# Patient Record
Sex: Male | Born: 1975 | Race: Black or African American | Hispanic: No | Marital: Married | Smoking: Never smoker
Health system: Southern US, Community
[De-identification: ages and names within clinical notes are randomized; demographics above are authoritative.]

## PROBLEM LIST (undated history)

## (undated) DIAGNOSIS — J302 Other seasonal allergic rhinitis: Secondary | ICD-10-CM

---

## 2003-11-27 ENCOUNTER — Emergency Department (HOSPITAL_COMMUNITY): Admission: EM | Admit: 2003-11-27 | Discharge: 2003-11-28 | Payer: Self-pay | Admitting: Emergency Medicine

## 2006-06-12 HISTORY — PX: APPENDECTOMY: SHX54

## 2006-10-20 ENCOUNTER — Inpatient Hospital Stay (HOSPITAL_COMMUNITY): Admission: EM | Admit: 2006-10-20 | Discharge: 2006-10-21 | Payer: Self-pay | Admitting: Emergency Medicine

## 2006-10-20 ENCOUNTER — Encounter (INDEPENDENT_AMBULATORY_CARE_PROVIDER_SITE_OTHER): Payer: Self-pay | Admitting: Specialist

## 2008-06-05 IMAGING — CT CT PELVIS W/ CM
2 of 5 series · 17 of 46 positions shown, 19 images · IV contrast (APPLIED)
Comparison: none

HISTORY: Right lower quadrant pain, leukocytosis, question appendicitis

[Series 2: abd_pel 5.0 b40f st · axial · 0.70mm/px · z∈[-490,-55]mm · 14 of 99 slices shown, 16 images]
[im 6/99  soft-tissue]
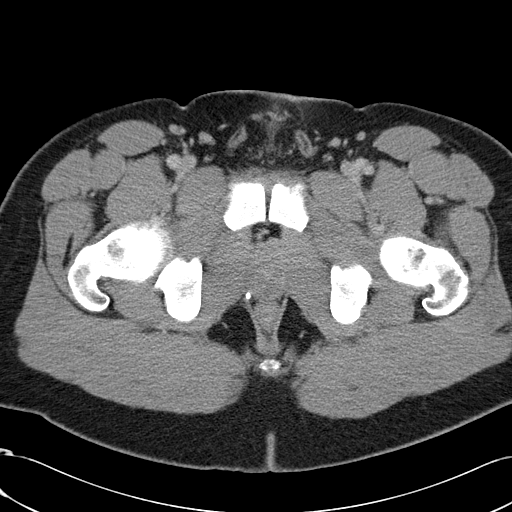
[im 6/99  bone]
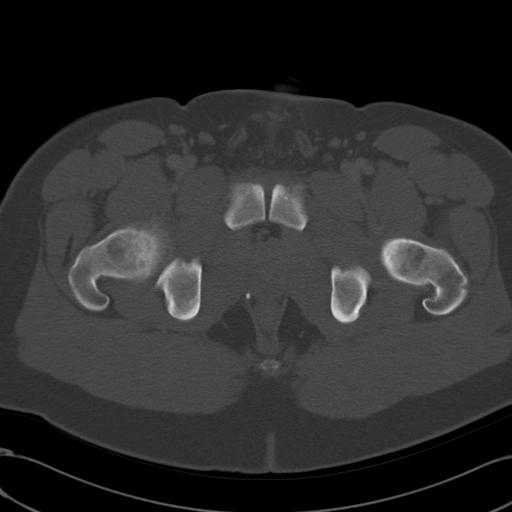
[im 11/99  soft-tissue]
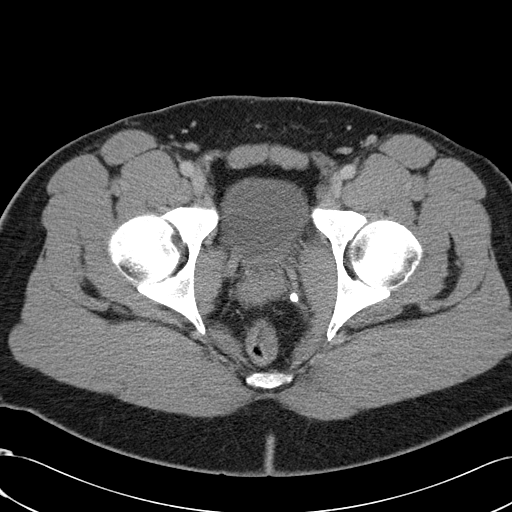
[im 21/99  soft-tissue]
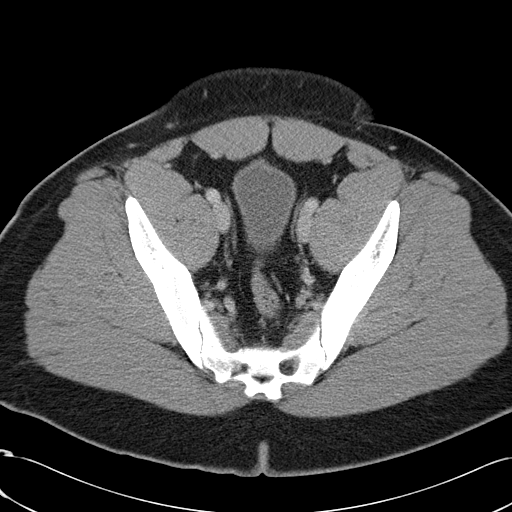
[im 26/99  soft-tissue]
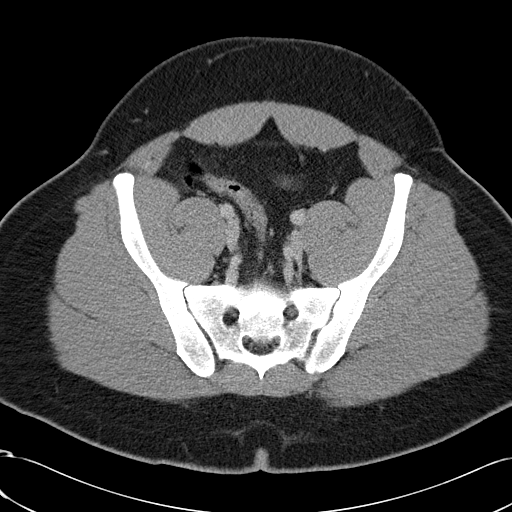
[im 31/99  soft-tissue]
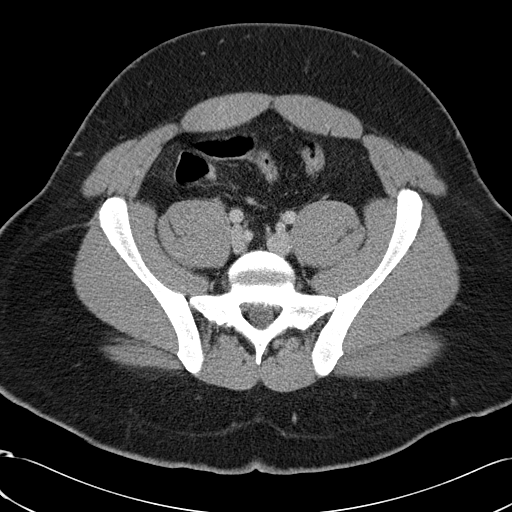
[im 42/99  soft-tissue]
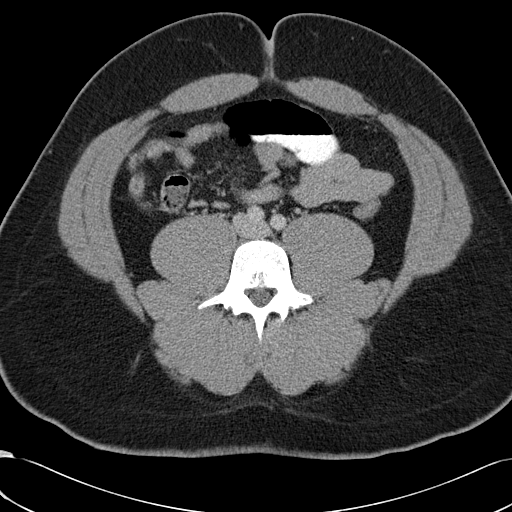
[im 47/99  soft-tissue]
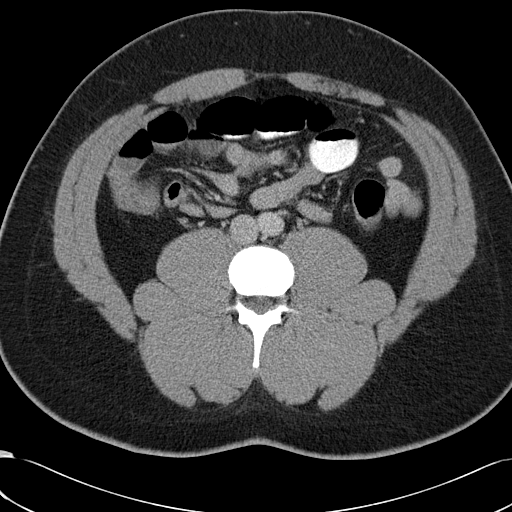
[im 52/99  soft-tissue]
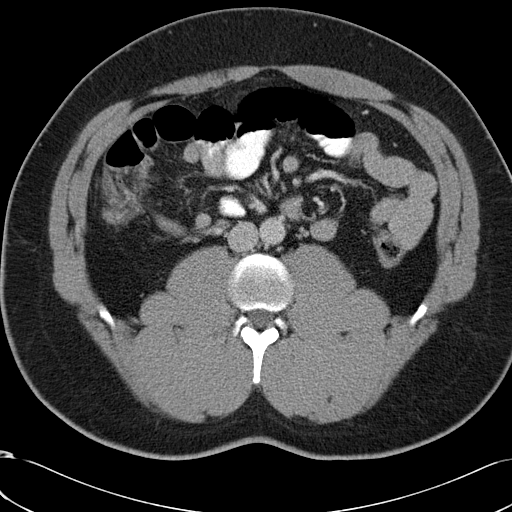
[im 57/99  soft-tissue]
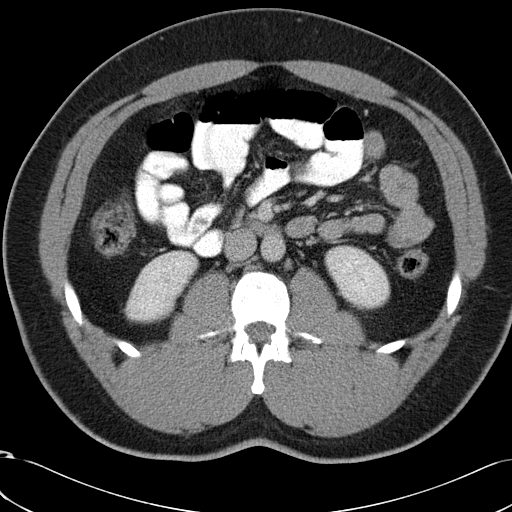
[im 57/99  bone]
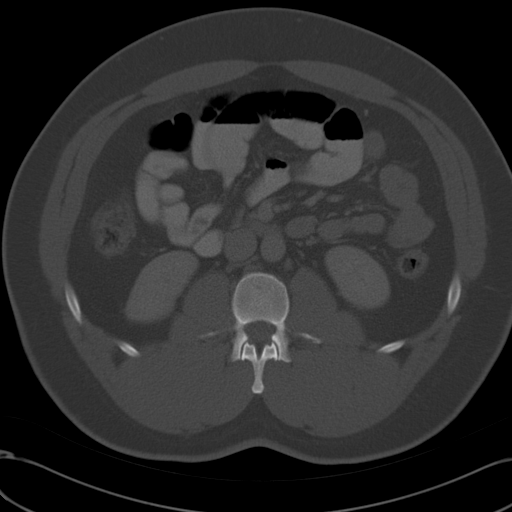
[im 68/99  soft-tissue]
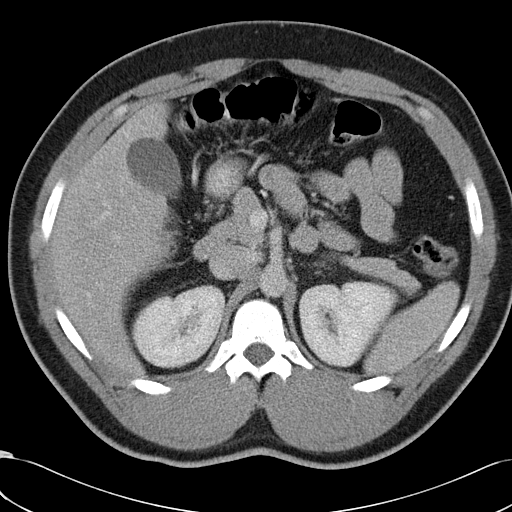
[im 73/99  soft-tissue]
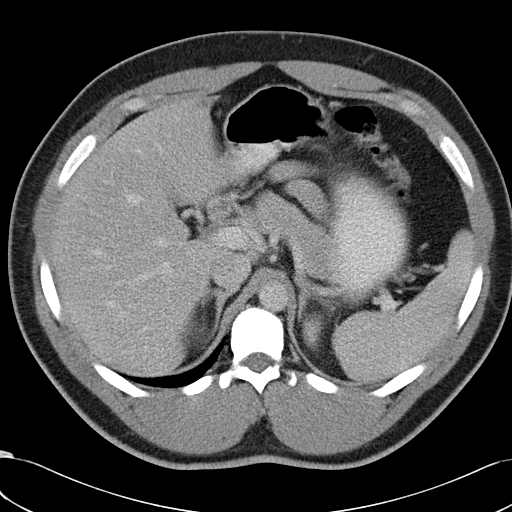
[im 78/99  soft-tissue]
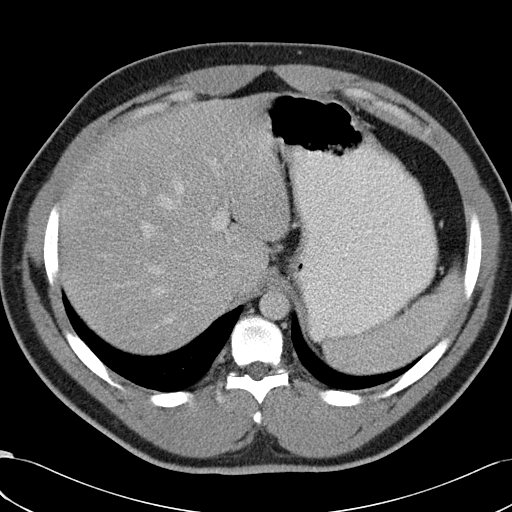
[im 88/99  soft-tissue]
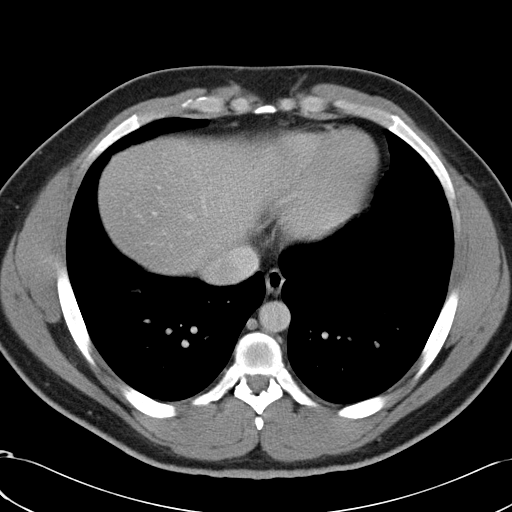
[im 93/99  soft-tissue]
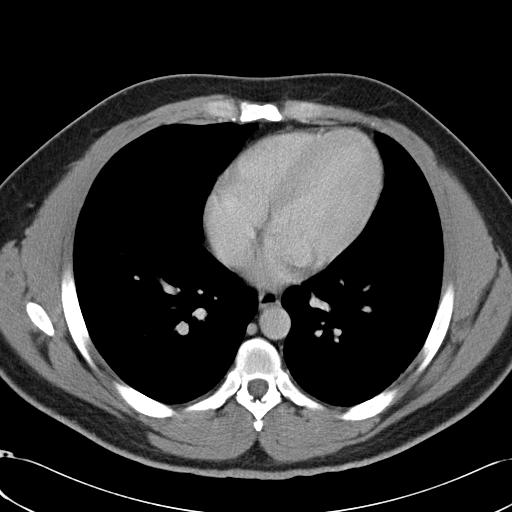

[Series 602: coronal abdomen · coronal · 1.00mm/px · 3 of 145 slices shown]
[im 49/145  soft-tissue]
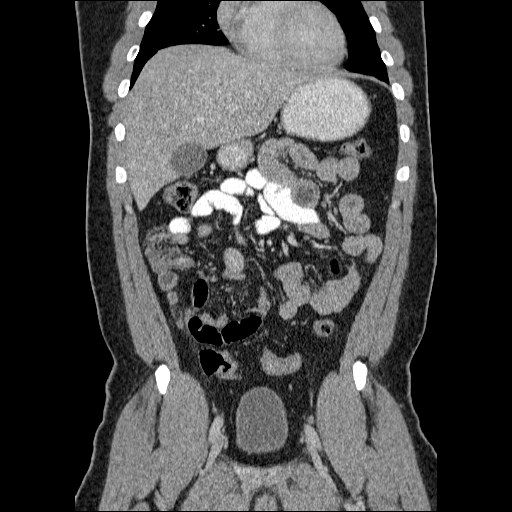
[im 65/145  soft-tissue]
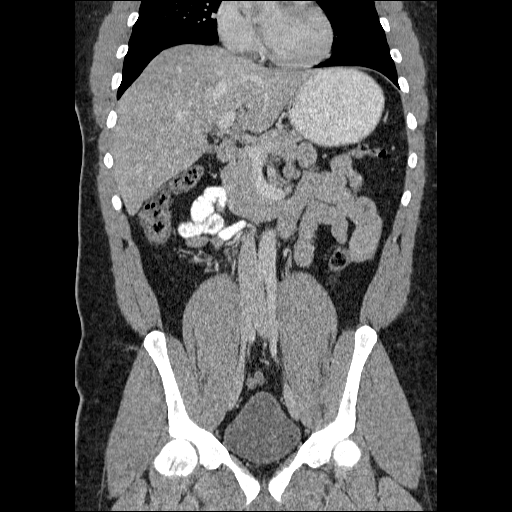
[im 81/145  soft-tissue]
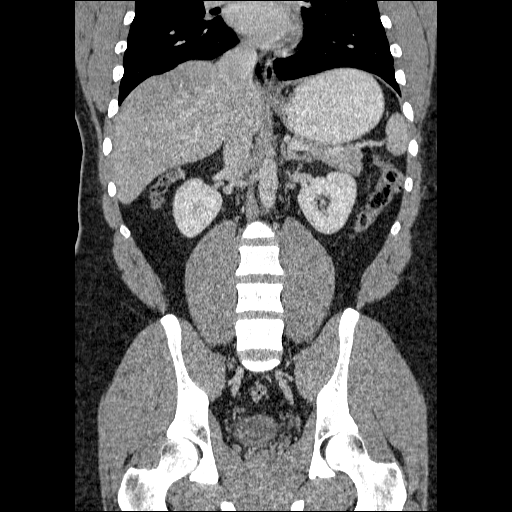

[17 of 46 positions shown; findings below may reference images not displayed]

CT ABDOMEN AND PELVIS WITH CONTRAST:

Multidetector helical CT imaging abdomen and pelvis performed.
Sagittal and coronal images are reconstructed from the axial data set.
Exam utilized dilute oral contrast and 125 cc Nmnipaque-[id number].
No prior study for comparison.

CT ABDOMEN:

Lung bases clear.
Liver, spleen, pancreas, kidneys, and adrenal gland is normal.
No upper abdominal mass, adenopathy, or free fluid.
Small appendicolith noted image 51.
Appendix thickened and enlarged with mild surrounding inflammatory changes.
Findings compatible with acute appendicitis.
No evidence of abscess or perforation.
Bones unremarkable.
IMPRESSION: Acute appendicitis.

CT PELVIS:

No pelvic mass, adenopathy, or free fluid.
No additional inflammatory processes seen.
Unremarkable bladder.
Scattered pelvic phleboliths.
No bone abnormalities.
IMPRESSION: Acute appendicitis, see above.
Findings discussed with Dr. Sen and Dr. Karbo.

## 2009-07-13 ENCOUNTER — Ambulatory Visit: Payer: Self-pay | Admitting: Diagnostic Radiology

## 2009-07-13 ENCOUNTER — Emergency Department (HOSPITAL_BASED_OUTPATIENT_CLINIC_OR_DEPARTMENT_OTHER): Admission: EM | Admit: 2009-07-13 | Discharge: 2009-07-13 | Payer: Self-pay | Admitting: Emergency Medicine

## 2009-07-14 ENCOUNTER — Ambulatory Visit: Payer: Self-pay | Admitting: Family

## 2009-07-14 DIAGNOSIS — J309 Allergic rhinitis, unspecified: Secondary | ICD-10-CM | POA: Insufficient documentation

## 2009-07-14 DIAGNOSIS — I10 Essential (primary) hypertension: Secondary | ICD-10-CM | POA: Insufficient documentation

## 2009-07-14 DIAGNOSIS — J329 Chronic sinusitis, unspecified: Secondary | ICD-10-CM | POA: Insufficient documentation

## 2009-07-23 ENCOUNTER — Ambulatory Visit: Payer: Self-pay | Admitting: Family

## 2009-07-23 LAB — CONVERTED CEMR LAB
BUN: 11 mg/dL (ref 6–23)
Basophils Absolute: 0 10*3/uL (ref 0.0–0.1)
Basophils Relative: 0 % (ref 0–1)
CO2: 25 meq/L (ref 19–32)
Creatinine, Ser: 1.18 mg/dL (ref 0.40–1.50)
Eosinophils Absolute: 0.1 10*3/uL (ref 0.0–0.7)
Eosinophils Relative: 1 % (ref 0–5)
Glucose, Bld: 150 mg/dL — ABNORMAL HIGH (ref 70–99)
HDL: 42 mg/dL (ref >39)
Hemoglobin: 15.4 g/dL (ref 13.0–17.0)
Lymphs Abs: 2.5 10*3/uL (ref 0.7–4.0)
MCV: 93.3 fL (ref 78.0–100.0)
Platelets: 304 10*3/uL (ref 150–400)
Potassium: 4.3 meq/L (ref 3.5–5.3)
RDW: 12.2 % (ref 11.5–15.5)
Sodium: 140 meq/L (ref 135–145)
Total CHOL/HDL Ratio: 3.3
VLDL: 32 mg/dL (ref 0–40)

## 2009-07-25 ENCOUNTER — Encounter: Payer: Self-pay | Admitting: Family

## 2009-08-20 ENCOUNTER — Ambulatory Visit: Payer: Self-pay | Admitting: Family

## 2009-08-20 DIAGNOSIS — K219 Gastro-esophageal reflux disease without esophagitis: Secondary | ICD-10-CM | POA: Insufficient documentation

## 2009-08-20 DIAGNOSIS — R0989 Other specified symptoms and signs involving the circulatory and respiratory systems: Secondary | ICD-10-CM

## 2009-08-20 DIAGNOSIS — R0609 Other forms of dyspnea: Secondary | ICD-10-CM

## 2009-08-22 LAB — CONVERTED CEMR LAB: Creatinine, Urine: 253.1 mg/dL

## 2009-09-03 ENCOUNTER — Ambulatory Visit: Payer: Self-pay | Admitting: Family

## 2009-09-03 DIAGNOSIS — R7309 Other abnormal glucose: Secondary | ICD-10-CM

## 2009-09-03 LAB — CONVERTED CEMR LAB
CO2: 25 meq/L (ref 19–32)
Calcium: 9.1 mg/dL (ref 8.4–10.5)
Sodium: 140 meq/L (ref 135–145)

## 2009-09-06 ENCOUNTER — Encounter: Payer: Self-pay | Admitting: Family

## 2009-09-17 ENCOUNTER — Ambulatory Visit: Payer: Self-pay | Admitting: Family

## 2009-09-17 DIAGNOSIS — D239 Other benign neoplasm of skin, unspecified: Secondary | ICD-10-CM | POA: Insufficient documentation

## 2009-10-12 ENCOUNTER — Encounter: Payer: Self-pay | Admitting: Family

## 2009-11-24 ENCOUNTER — Encounter: Payer: Self-pay | Admitting: Family

## 2010-02-11 ENCOUNTER — Ambulatory Visit: Payer: Self-pay | Admitting: Family

## 2010-02-11 DIAGNOSIS — F341 Dysthymic disorder: Secondary | ICD-10-CM

## 2010-02-17 ENCOUNTER — Ambulatory Visit: Payer: Self-pay | Admitting: Licensed Clinical Social Worker

## 2010-02-17 ENCOUNTER — Telehealth: Payer: Self-pay | Admitting: Family

## 2010-02-21 ENCOUNTER — Ambulatory Visit: Payer: Self-pay | Admitting: Family

## 2010-02-21 ENCOUNTER — Telehealth: Payer: Self-pay | Admitting: Family

## 2010-03-16 ENCOUNTER — Ambulatory Visit: Payer: Self-pay | Admitting: Family

## 2010-03-16 DIAGNOSIS — N529 Male erectile dysfunction, unspecified: Secondary | ICD-10-CM

## 2010-03-18 ENCOUNTER — Encounter: Payer: Self-pay | Admitting: Family

## 2010-03-22 ENCOUNTER — Telehealth: Payer: Self-pay | Admitting: Family

## 2010-03-22 ENCOUNTER — Encounter: Payer: Self-pay | Admitting: Family

## 2010-03-28 ENCOUNTER — Telehealth: Payer: Self-pay | Admitting: Family

## 2010-04-18 ENCOUNTER — Encounter: Payer: Self-pay | Admitting: Family

## 2010-04-18 ENCOUNTER — Telehealth: Payer: Self-pay | Admitting: Family

## 2010-07-12 NOTE — Assessment & Plan Note (Signed)
Summary: mole removal/mhf   Vital Signs:  Patient profile:   35 year old male Height:      72 inches Weight:      268.50 pounds BMI:     36.55 Temp:     98.3 degrees F oral Pulse rate:   66 / minute Pulse rhythm:   regular Resp:     16 per minute BP sitting:   118 / 84  (right arm) Cuff size:   large  Vitals Entered By: Mervin Kung CMA (September 17, 2009 9:13 AM) CC: room 3  Pt states he is here for mole removal.   CC:  room 3  Pt states he is here for mole removal..  History of Present Illness: Joel Gallagher is a 35 year old male who presents today for removal of a mole from his right lower back.  He states that this mole has been present since the age of 89- it often bothers him since it rubs against clothing.    Allergies (verified): No Known Drug Allergies  Physical Exam  General:  Well-developed,well-nourished,in no acute distress; alert,appropriate and cooperative throughout examination Head:  Normocephalic and atraumatic without obvious abnormalities. No apparent alopecia or balding. Skin:  Hyperpigmented tag like lesion on patient's right lower back.   Impression & Recommendations:  Problem # 1:  MOLE (ICD-216.9) Assessment Comment Only  Consent was signed, area was cleansed with iodine, then injection of lidocaine with epi was performed to provide local anesthesic prior to mole removal.  Mole was removed using sterile technique and placed in medium for transport to pathology.  Hemostasis was achieved with cautery.  Bandage placed.  Pt instructed to keep area clean and dry- call if redness, drainage, or swelling of the area occurs.  Pt verbalized understanding.  Orders: Shave Skin Lesion < 0.5 cm/trunk/arm/leg (11300)  Complete Medication List: 1)  Flonase 50 Mcg/act Susp (Fluticasone propionate) .... 2 sprays each nostril daily as needed. 2)  Prilosec 20 Mg Cpdr (Omeprazole) .... One tablet by mouth daily 3)  Lisinopril 10 Mg Tabs (Lisinopril) .... One tablet by  mouth daily  Current Allergies (reviewed today): No known allergies

## 2010-07-12 NOTE — Miscellaneous (Signed)
Summary: Orders Update  Clinical Lists Changes  Orders: Added new Test order of TLB-Lipid Panel (80061-LIPID) - Signed Added new Test order of TLB-CBC Platelet - w/Differential (85025-CBCD) - Signed Added new Test order of TLB-BMP (Basic Metabolic Panel-BMET) (80048-METABOL) - Signed

## 2010-07-12 NOTE — Assessment & Plan Note (Signed)
Summary: 2 week follow up/mhf   Vital Signs:  Patient profile:   35 year old male Height:      72 inches Weight:      265.50 pounds BMI:     36.14 Temp:     98.0 degrees F oral Pulse rate:   72 / minute Pulse rhythm:   regular Resp:     16 per minute BP sitting:   122 / 82  (right arm) Cuff size:   large  Vitals Entered By: Joel Gallagher CMA (September 03, 2009 2:29 PM) CC: room 5  Follow up of blood pressure. Pt states he is feeling well.   CC:  room 5  Follow up of blood pressure. Pt states he is feeling well.Marland Kitchen  History of Present Illness: Joel Gallagher is a 35 year old male who presents today for follow up of his blood pressure.  Notes that he tolerating lisinopril without cough or other side effects.  Denies HA or lower extremity edema.  Following a low sodium diet.    Hyperglycemia- Last glucose was 150, pt initially told me that he was fasting. Patient tells me that in fact he was not fasting when his glucose was drawn- he at a big mac and an apple pie prior to blood draw.  A1C was 5.7.  GERD- reports that these symptoms have completely resolved since initiation of PPI.  Allergies (verified): No Known Drug Allergies  Physical Exam  General:  Well-developed,well-nourished,in no acute distress; alert,appropriate and cooperative throughout examination Lungs:  Normal respiratory effort, chest expands symmetrically. Lungs are clear to auscultation, no crackles or wheezes. Heart:  Normal rate and regular rhythm. S1 and S2 normal without gallop, murmur, click, rub or other extra sounds.   Impression & Recommendations:  Problem # 1:  ELEVATED BLOOD PRESSURE WITHOUT DIAGNOSIS OF HYPERTENSION (ICD-796.2) Assessment New  His updated medication list for this problem includes:    Lisinopril 10 Mg Tabs (Lisinopril) ..... One tablet by mouth daily  BP today: 122/82 Prior BP: 130/96 (08/20/2009)  Labs Reviewed: Creat: 1.18 (07/23/2009) Chol: 140 (07/23/2009)   HDL: 42 (07/23/2009)    LDL: 66 (07/23/2009)   TG: 160 (07/23/2009)  Instructed in low sodium diet (DASH Handout) and behavior modification.    Orders: T-Basic Metabolic Panel [phone])  Problem # 2:  HYPERGLYCEMIA, MILD (ICD-790.29) Assessment: Comment Only glucose of 150 is less concerning in setting of this result being post prandial and normal A1C.  Monitor.    Problem # 3:  GERD (ICD-530.81) Assessment: Improved symptoms improved, continue prilosec. His updated medication list for this problem includes:    Prilosec 20 Mg Cpdr (Omeprazole) ..... One tablet by mouth daily  Complete Medication List: 1)  Flonase 50 Mcg/act Susp (Fluticasone propionate) .... 2 sprays each nostril daily as needed. 2)  Prilosec 20 Mg Cpdr (Omeprazole) .... One tablet by mouth daily 3)  Lisinopril 10 Mg Tabs (Lisinopril) .... One tablet by mouth daily  Patient Instructions: 1)  Please follow up in 3 months for your blood pressure. 2)  Follow up as scheduled for mole removal.  Current Allergies (reviewed today): No known allergies

## 2010-07-12 NOTE — Miscellaneous (Signed)
Summary: Consent to Procedure/[facility]  Consent to Procedure/[facility]   Imported By: Lanelle Bal 09/23/2009 14:12:39  _____________________________________________________________________  External Attachment:    Type:   Image     Comment:   External Document

## 2010-07-12 NOTE — Letter (Signed)
   [place] at [facility] [address] [place], [place]  [address]  [place] Phone: [phone]      Oct 12, 2009   Joel Gallagher [address] [place], [place] [address]  RE:  LAB RESULTS  Dear  [name],  The following is an interpretation of your most recent lab tests.  Please take note of any instructions provided or changes to medications that have resulted from your lab work.   The pathology report for your mole is negative for any skin cancer.   Sincerely Yours,    Lemont Fillers FNP

## 2010-07-12 NOTE — Letter (Signed)
   [facility] at [facility] [address] [place], [place] [address]   September 06, 2009   Joel Gallagher [address] [place], [place] [address]  RE:  LAB RESULTS  Dear  [name],  The following is an interpretation of your most recent lab tests.  Please take note of any instructions provided or changes to medications that have resulted from your lab work.  ELECTROLYTES:  Good - no changes needed  KIDNEY FUNCTION TESTS:  Good - no changes needed     Sincerely Yours,    Lemont Fillers FNP

## 2010-07-12 NOTE — Letter (Signed)
Summary: Generic Letter  [place] at [facility]  [address]   [place], [place] [address]   Phone: [phone]  Fax: [phone]    04/18/2010  To whom it may concern,  For health reasons, it would be beneficial if Mr. Brandel could be transferred to a less stressful position within his company.  Please take this into consideration if a different position becomes available.     Sincerely,   Sandford Craze FNP

## 2010-07-12 NOTE — Letter (Signed)
Summary: Unable to Contact Patient/[facility]   Unable to Contact Patient/[facility]   Imported By: Lanelle Bal 12/27/2009 08:33:51  _____________________________________________________________________  External Attachment:    Type:   Image     Comment:   External Document

## 2010-07-12 NOTE — Progress Notes (Signed)
Summary: additional info needed for [employer]  Phone Note Other Incoming   Caller: [employer] Disability Summary of Call: Received voice message from [facility] Disability [phone] asking that we return their call and reference claim # [id number]. Stated they need additional info to process claim.  Returned call and spoke to [place], she was unable to access info and transferred me to the voicemail of case Production designer, theatre/television/film, Erline Levine. Left message for Herbert Seta to return my call. Nicki Guadalajara Fergerson CMA Duncan Dull)  March 28, 2010 4:22 PM   Follow-up for Phone Call        Spoke to [place] at [facility]. He states there is another questionaire with multiple questions that he will fax to [place] to complete.  Notified pt of status and that we would call him back once additional paperwork has been completed and faxed.  Nicki Guadalajara Fergerson CMA Duncan Dull)  March 30, 2010 4:21 PM   Additional Follow-up for Phone Call Additional follow up Details #1::        Left message for pt to return my call. Need to inform pt that I will be faxing additional form and most recent office notes to [employer]. Nicki Guadalajara Fergerson CMA Duncan Dull)  April 01, 2010 3:19 PM     Additional Follow-up for Phone Call Additional follow up Details #2::    Left message on machine to return my call. Nicki Guadalajara Fergerson CMA Duncan Dull)  April 06, 2010 8:09 AM   Pt returned my call and was notified that additional form and office note was faxed to [facility] on Friday. Pt voices understanding. Nicki Guadalajara Fergerson CMA Duncan Dull)  April 06, 2010 10:33 AM

## 2010-07-12 NOTE — Assessment & Plan Note (Signed)
Summary: NEW SORE THROAT AND HEADACHE/MHF   Vital Signs:  Patient profile:   35 year old male Height:      72 inches Weight:      267 pounds BMI:     36.34 Temp:     98.5 degrees F oral Pulse rate:   84 / minute BP sitting:   134 / 98  (left arm)  Vitals Entered By: Doristine Devoid (July 14, 2009 3:03 PM) CC: NEW EST- SORE THROAT ALONG W/ COUGH AND CHEST CONGESTION X4 DAYS    CC:  NEW EST- SORE THROAT ALONG W/ COUGH AND CHEST CONGESTION X4 DAYS .  History of Present Illness: Joel Gallagher is a 35 year old male who presents today to establish care.  Notes + sore throat, nasal congestion x 4 days.  Notes + sweats but has not taken his fever.  Nasal congestion is clear.  + sinus pressure/headache.  Notes that sore throat is "really bad in the mornings to the point that he can barely swallow".  Denies nausea vomitting or diarrhea.  Has had multiple sick contacts at work.  Has used robitussin with temporary relief.  Continues to have cough.  Cough is generally dry.    Has not had a primary care provider since high school.    Preventive Screening-Counseling & Management  Alcohol-Tobacco     Smoking Status: never      Drug Use:  no.    Allergies (verified): No Known Drug Allergies  Past History:  Family History: Last updated: 07/14/2009 CAD-no HTN-mother DM-father STROKE-no COLON CA-no PROSTATE CA-no  Mom- HTN Dad- DM2 5 brothers- alive and well- one brother with asthma no children  Social History: Last updated: 07/14/2009 Occupation:Collection Agent Married Never Smoked Alcohol use-no Drug use-no  Risk Factors: Smoking Status: never (07/14/2009)  Past Medical History: Allergic rhinitis  Past Surgical History: Appendectomy-2008  Family History: CAD-no HTN-mother DM-father STROKE-no COLON CA-no PROSTATE CA-no  Mom- HTN Dad- DM2 5 brothers- alive and well- one brother with asthma no children  Social History: Occupation:Collection  Agent Married Never Smoked Alcohol use-no Drug use-no Smoking Status:  never Drug Use:  no  Physical Exam  General:  Well-developed,well-nourished,in no acute distress; alert,appropriate and cooperative throughout examination Ears:  bilateral TM scarring, no bulging noted.   Mouth:  mild pharygeal erythema Neck:  No deformities, masses, or tenderness noted. Lungs:  Normal respiratory effort, chest expands symmetrically. Lungs are clear to auscultation, no crackles or wheezes. Heart:  Normal rate and regular rhythm. S1 and S2 normal without gallop, murmur, click, rub or other extra sounds.   Impression & Recommendations:  Problem # 1:  SINUSITIS (ICD-473.9) Assessment New Patient is already taking doxycycline two times a day as part of IVF protocol that he and his wife are undergoing.  Plan to continue doxy, he is scheduled to continue x 4 more days. I instructed patient to call if his symptoms worsen or if they are not improved by the time he completes the doxycycline.   Recommended flonase and Net pot .   His updated medication list for this problem includes:    Flonase 50 Mcg/act Susp (Fluticasone propionate) .Marland Kitchen... 2 sprays each nostril daily    Doxycycline Hyclate 100 Mg Solr (Doxycycline hyclate) ..... One tablet by mouth two times a day  Problem # 2:  ELEVATED BLOOD PRESSURE WITHOUT DIAGNOSIS OF HYPERTENSION (ICD-796.2) Assessment: New Patient counselled on low sodium diet.  Will plan to f/u in 2 weeks for a complete physical  and will check his blood pressure at that visit.    Complete Medication List: 1)  Flonase 50 Mcg/act Susp (Fluticasone propionate) .... 2 sprays each nostril daily 2)  Doxycycline Hyclate 100 Mg Solr (Doxycycline hyclate) .... One tablet by mouth two times a day  Patient Instructions: 1)  Please return fasting for the following labs: 2)  CBC, BMET, FLP (v70) 3)  Follow up in 2 weeks for a complete physical.  Call if your sinus congestion worsens or  does not improve.   4)  You may use Coriciden for high blood pressure (OTC) for congestion or try using a neti pot twice daily. Prescriptions: FLONASE 50 MCG/ACT SUSP (FLUTICASONE PROPIONATE) 2 sprays each nostril daily  #1 x 0   Entered and Authorized by:   Lemont Fillers FNP   Signed by:   Lemont Fillers FNP on 07/14/2009   Method used:   Electronically to        [facility] (retail)       [address]       [place], [place]  [address]       Ph: [phone] or [phone]       Fax: [phone]   RxID:   [id number]

## 2010-07-12 NOTE — Progress Notes (Signed)
Summary: FMLA, [employer] forms faxed  Phone Note Outgoing Call   Summary of Call: Pls call [employer] at [phone] to re-open his disability claim.  Per patient the 10 day limit is up and they need our call to open it back up.   Initial call taken by: Lemont Fillers FNP,  February 21, 2010 4:56 PM  Follow-up for Phone Call        Spoke to [place] at [place], she states we just need to fax the paperwork to them and the case will be re-opened. Fax # [phone].  Notified pt he needs to come by office today and sign [employer] form before I can fax it in.  Pt states he will come in and ask for me. Nicki Guadalajara Fergerson CMA Duncan Dull)  February 22, 2010 9:36 AM   Additional Follow-up for Phone Call Additional follow up Details #1::        Pt came by and signed additional paperwork then [facility] and Attending Physician Statement faxed to above number. Nicki Guadalajara Fergerson CMA Duncan Dull)  February 22, 2010 2:01 PM

## 2010-07-12 NOTE — Letter (Signed)
   [place] at [facility] [address] [place], [place]  [address]  [place] Phone: [phone]      July 25, 2009   Joel Gallagher [id number] [place], [place] [address]  RE:  LAB RESULTS  Dear  [name],  The following is an interpretation of your most recent lab tests.  Please take note of any instructions provided or changes to medications that have resulted from your lab work.  ELECTROLYTES:  Good - no changes needed  KIDNEY FUNCTION TESTS:  Good - no changes needed  LIPID PANEL:  Stable - no changes needed Triglyceride: 160   Cholesterol: 140   LDL: 66   HDL: 42   Chol/HDL%:  3.3 Ratio  DIABETIC STUDIES:  Poor - schedule a follow-up appointment soon Blood Glucose: 150    CBC:  Good - no changes needed  We will do some additional blood work at your upcoming appointment.  Your sugar was a little elevated.  Please keep your appointment.     Sincerely Yours,    Lemont Fillers FNP

## 2010-07-12 NOTE — Letter (Signed)
   [place] at [facility] [address] [place], [place]  [address]  [place] Phone: [phone]      March 18, 2010   Joel Gallagher [address] [place], [place] [address]  RE:  LAB RESULTS  Dear  Mr. SHIN,  The following is an interpretation of your most recent lab tests.  Please take note of any instructions provided or changes to medications that have resulted from your lab work.   Your testosterone level is normal.    Sincerely Yours,    Lemont Fillers FNP  Appended Document:  Mailed.

## 2010-07-12 NOTE — Miscellaneous (Signed)
Summary: flu vaccine  Clinical Lists Changes  Orders: Added new Service order of Flu Vaccine 58yrs + [phone]) - Signed Added new Service order of Admin 1st Vaccine ([address]) - Signed Observations: Added new observation of FLU VAX#1VIS: 01/04/10 version given March 16, 2010. (03/16/2010 9:25) Added new observation of FLU VAXLOT: [id number] (03/16/2010 9:25) Added new observation of FLU VAX EXP: 12/10/2010 (03/16/2010 9:25) Added new observation of FLU VAXBY: Tricia Fergerson CMA (AAMA) (03/16/2010 9:25) Added new observation of FLU VAXRTE: IM (03/16/2010 9:25) Added new observation of FLU VAX DSE: 0.5 ml (03/16/2010 9:25) Added new observation of FLU VAXMFR: GlaxoSmithKline (03/16/2010 9:25) Added new observation of FLU VAX SITE: left deltoid (03/16/2010 9:25) Added new observation of FLU VAX: Fluvax 3+ (03/16/2010 9:25)      Immunizations Administered:  Influenza Vaccine # 1:    Vaccine Type: Fluvax 3+    Site: left deltoid    Mfr: GlaxoSmithKline    Dose: 0.5 ml    Route: IM    Given by: Mervin Kung CMA (AAMA)    Exp. Date: 12/10/2010    Lot #: [id number]    VIS given: 01/04/10 version given March 16, 2010.  Flu Vaccine Consent Questions:    Do you have a history of severe allergic reactions to this vaccine? no    Any prior history of allergic reactions to egg and/or gelatin? no    Do you have a sensitivity to the preservative Thimersol? no    Do you have a past history of Guillan-Barre Syndrome? no    Do you currently have an acute febrile illness? no    Have you ever had a severe reaction to latex? no    Vaccine information given and explained to patient? yes

## 2010-07-12 NOTE — Letter (Signed)
Summary: Generic Letter  [place] at [facility]  [address]   [place], [place] [address]   Phone: [phone]  Fax: [phone]    03/22/2010  To whom it may concern,  This letter is in reference to patient [name] case number [id number].  Due to medical reasons, his FMLA absence has been continued through 10/12.  He is clear for return to work on 10/13.   Sincerely,   Sandford Craze FNP  Appended Document: Generic Letter Faxed to [phone].

## 2010-07-12 NOTE — Assessment & Plan Note (Signed)
Summary: 1 month follow up/mhf--Rm 5   Vital Signs:  Patient profile:   35 year old male Height:      72 inches Weight:      267.50 pounds BMI:     36.41 Temp:     98.2 degrees F oral Pulse rate:   72 / minute Pulse rhythm:   regular Resp:     16 per minute BP sitting:   138 / 100  (left arm) Cuff size:   large  Vitals Entered By: Mervin Kung CMA Duncan Dull) (March 16, 2010 8:01 AM) CC: Rm 5  1 month f/u , Depression Is Patient Diabetic? No Pain Assessment Patient in pain? no      Comments Pt states he is having periodic problems with obtaining an erection.  Pt is no longer taking Flonase. Pt would like flu shot today. Nicki Guadalajara Fergerson CMA Duncan Dull)  March 16, 2010 8:09 AM    Primary Care Provider:  Lemont Fillers FNP  CC:  Rm 5  1 month f/u  and Depression.  History of Present Illness: Mr Brenning is a 35 year old male who presents today for follow up of his depresssion.  Last visit, he was started on sertraline and referred to a therapist following the loss of his 37 month gestation son.  Since that time he notes that he is doing better overall.  He is in the process of arranging therapy sessions together with his wife.  He has not worked since 9/2.  He has applied to a new position at his work, but he has not yet heard back about this potential transfer.    He does report that he has some issues with erectile dysfunction.  This occurrs about 10% of the time. He sometimes has difficulty obtaining an erection, and other times has difficulty maintaining an erection.  He feels that this is related to his lisinopril.  These symptoms started before the initiation of sertraline.  Depression History:      The patient denies insomnia, hypersomnia, fatigue (loss of energy), impaired concentration (indecisiveness), and recurrent thoughts of death or suicide.        Psychosocial stress factors include a recent traumatic event.         Allergies (verified): No Known Drug  Allergies  Past History:  Past Medical History: Last updated: 07/14/2009 Allergic rhinitis  Past Surgical History: Last updated: 07/14/2009 Appendectomy-2008  Physical Exam  General:  Well-developed,well-nourished,in no acute distress; alert,appropriate and cooperative throughout examination Lungs:  Normal respiratory effort, chest expands symmetrically. Lungs are clear to auscultation, no crackles or wheezes. Heart:  Normal rate and regular rhythm. S1 and S2 normal without gallop, murmur, click, rub or other extra sounds. Psych:  Cognition and judgment appear intact. Alert and cooperative with normal attention span and concentration. No apparent delusions, illusions, hallucinations   Impression & Recommendations:  Problem # 1:  ANXIETY DEPRESSION (ICD-300.4) Assessment Improved Symptoms are improved.  Plan to continue sertraline and as needed clonazepam.  Pt is arranging group therapy with his wife- I encouraged him to do so.  He is hoping for a new position at work, but is not sure if and when this will become available.  Will plan to have patient return to work next week.  20 minutes were spent with patient.  Greater than 50% of this time was spent counseling pt on his anxiety and depression.   Problem # 2:  HYPERTENSION (ICD-401.9) Assessment: Unchanged DBP is not at goal.  Will change lisinopril to amlodipine, f/u in 1 month. His updated medication list for this problem includes:    Amlodipine Besylate 5 Mg Tabs (Amlodipine besylate) ..... One tablet by mouth once daily  BP today: 138/100 Prior BP: 138/108 (02/11/2010)  Labs Reviewed: Creat: 1.02 (09/03/2009) Chol: 140 (07/23/2009)   HDL: 42 (07/23/2009)   LDL: 66 (07/23/2009)   TG: 160 (07/23/2009)  Instructed in low sodium diet (DASH Handout) and behavior modification.    Problem # 3:  ERECTILE DYSFUNCTION, SECONDARY TO MEDICATION (YQI-347.42) Assessment: New May be due to lisinopril.  Will switch to amlodipine trial  and also check testosterone level.   Orders: T-Testosterone, Free and Total [phone])  Complete Medication List: 1)  Flonase 50 Mcg/act Susp (Fluticasone propionate) .... 2 sprays each nostril daily as needed. 2)  Prilosec 20 Mg Cpdr (Omeprazole) .... One tablet by mouth daily 3)  Amlodipine Besylate 5 Mg Tabs (Amlodipine besylate) .... One tablet by mouth once daily 4)  Sertraline Hcl 50 Mg Tabs (Sertraline hcl) .... One tablet by mouth daily 5)  Klonopin 0.5 Mg Tabs (Clonazepam) .... One tablet by mouth three times a day as needed anxiety  Patient Instructions: 1)  Complete your lab work downstairs this morning.  2)  Please follow up in 1 month. Prescriptions: AMLODIPINE BESYLATE 5 MG TABS (AMLODIPINE BESYLATE) one tablet by mouth once daily  #30 x 3   Entered and Authorized by:   Lemont Fillers FNP   Signed by:   Lemont Fillers FNP on 03/16/2010   Method used:   Electronically to        [id number] (retail)       [address]       [place], [place]  [address]       Ph: [phone] or [phone]       Fax: [phone]   RxID:   [id number]   Current Allergies (reviewed today): No known allergies

## 2010-07-12 NOTE — Progress Notes (Signed)
Summary: letter of necessity  Phone Note Call from Patient Call back at [phone]-- [place] (wife)   Caller: Spouse Call For: Lemont Fillers FNP Summary of Call: Received voice message from pt's wife stating that pt is able to change job positions at work but will need a letter of recommendation / necessity from [place].  Left message for pt to return my call. ?Specific info needed in the letter, ?fax # to send letter or will pt pick up letter. Nicki Guadalajara Fergerson CMA Duncan Dull)  April 18, 2010 4:01 PM   Follow-up for Phone Call        Pt's wife returned my call. States there are 2 possible postions that pt could be moved to that would be less stressful. Pt needs letter from [place] recommending change for health reasons.  Please call pt's wife when letter is ready to be picked up. Nicki Guadalajara Fergerson CMA Duncan Dull)  April 18, 2010 4:07 PM   Additional Follow-up for Phone Call Additional follow up Details #1::        Letter is complete. Additional Follow-up by: Lemont Fillers FNP,  April 18, 2010 4:19 PM    Additional Follow-up for Phone Call Additional follow up Details #2::    Pt's wife notified. Nicki Guadalajara Fergerson CMA Duncan Dull)  April 19, 2010 8:19 AM

## 2010-07-12 NOTE — Progress Notes (Signed)
Summary: needs a note to be out of work   Phone Note Call from Patient Call back at [phone]   Caller: Patient Call For: Lemont Fillers FNP Summary of Call: The patient stopped by and wanted to know if Ariyona Eid had written him a note excusing him from work until his next appointment on October 5th  when he returns to see Annalia Metzger.  Please advise patient    Initial call taken by: Roselle Locus,  February 17, 2010 1:33 PM  Follow-up for Phone Call        call was returned to patient at [phone].  He states he was seen for headaches and anxiety. and was wanting to know if he could be written out of work on [employer] per conversation with Efraim Kaufmann Follow-up by: Glendell Docker CMA,  February 17, 2010 2:03 PM  Additional Follow-up for Phone Call Additional follow up Details #1::        Call was returned to patient after her  stopped by office , and completed a medical release for [employer]. He states FMLA forms have been sent to office for completion.   He was informed that Efraim Kaufmann was out of the office until Monday ,and I woudl follow up with regarding the paperwork. He verablized understanding and agrees to wait until Monday.    Additional Follow-up by: Glendell Docker CMA,  February 18, 2010 2:49 PM    Additional Follow-up for Phone Call Additional follow up Details #2::    Left message for patient to return my call. Follow-up by: Lemont Fillers FNP,  February 21, 2010 9:13 AM  Additional Follow-up for Phone Call Additional follow up Details #3:: Details for Additional Follow-up Action Taken: Pt returned my call notes that he met with the therapist and that he is noticing some mild improvement with sertraline.  He has been unable to return to work since 9/2.   Would like FMLA filled out as well as [employer] disability.  He will drop off FMLA papers.   Additional Follow-up by: Lemont Fillers FNP,  February 21, 2010 9:35 AM

## 2010-07-12 NOTE — Progress Notes (Signed)
Summary: Correction of Return to work  Phone Note Call from Patient Call back at [phone]  or [phone]   Caller: Patient Call For: Lemont Fillers FNP Summary of Call: Received  call from pt stating that he asked at his last visit to return to work on Thursday 03/24/10. FMLA paperwork had return to work date as 03/17/10. Pt states we were supposed to send a letter of correction.  ph) [phone]  fax) [phone].  Letter needs to include claim number [id number].  Please advise. Nicki Guadalajara Fergerson CMA Duncan Dull)  March 22, 2010 4:52 PM   Follow-up for Phone Call        Letter is complete. Follow-up by: Lemont Fillers FNP,  March 22, 2010 4:58 PM  Additional Follow-up for Phone Call Additional follow up Details #1::        Letter faxed to above number. Pt notified. Nicki Guadalajara Fergerson CMA Duncan Dull)  March 22, 2010 5:08 PM

## 2010-07-12 NOTE — Assessment & Plan Note (Signed)
Summary: elevated blood sugar-lb--Rm 4   Vital Signs:  Patient profile:   35 year old male Height:      72 inches Weight:      265.50 pounds BMI:     36.14 Temp:     98.0 degrees F oral Pulse rate:   72 / minute Pulse rhythm:   regular Resp:     18 per minute BP sitting:   138 / 108  (right arm) Cuff size:   large  Vitals Entered By: Mervin Kung CMA Duncan Dull) (February 11, 2010 3:47 PM) CC: Rm 4   Pt having headaches x 2weeks. Under lot of stress. Is Patient Diabetic? No Comments Has been out of BP med since May. Nicki Guadalajara Fergerson CMA Duncan Dull)  February 11, 2010 3:52 PM    Primary Care Carinna Newhart:  Lemont Fillers FNP  CC:  Rm 4   Pt having headaches x 2weeks. Under lot of stress.Marland Kitchen  History of Present Illness: Joel Gallagher is a 35 year old male who presents today following the loss of his 57 month gestation son.  Patient tells me that the baby's heart "just stopped."  This occurred about 4 weeks ago.  This would have been his wife's and his first child.  He also recently was reassigned a new position at work which he is finding to be very demanding.  He is having difficulty concentrating on his work.  Notes that he develops episodes of palpitations, sweating.  Having difficulty sleeping some nights.  Ran out of his blood pressure medication.  Allergies (verified): No Known Drug Allergies  Physical Exam  General:  Well-developed,well-nourished,in no acute distress; alert,appropriate and cooperative throughout examination Psych:  Oriented X3, memory intact for recent and remote, and normally interactive.  Patient was tearful during most of the interview.   Impression & Recommendations:  Problem # 1:  ANXIETY DEPRESSION (ICD-300.4) Assessment New  Largely situational.  Spent 25 minutes with patient.  Greater than 50% of this time was spent counseling him on his depression/anxiety.  I have recommended that he start seeing a therapist to help him through this very difficult time.   Will give a trial of sertraline along with Klonopin to be used on a as needed basis.  Recommended that he avoid driving while using klonopin until he knows how this will affect him.  Pt instructed to f/u in 1 month.  Orders: Psychology Referral (Psychology)  Complete Medication List: 1)  Flonase 50 Mcg/act Susp (Fluticasone propionate) .... 2 sprays each nostril daily as needed. 2)  Prilosec 20 Mg Cpdr (Omeprazole) .... One tablet by mouth daily 3)  Lisinopril 10 Mg Tabs (Lisinopril) .... One tablet by mouth daily 4)  Sertraline Hcl 50 Mg Tabs (Sertraline hcl) .... One tablet by mouth daily 5)  Klonopin 0.5 Mg Tabs (Clonazepam) .... One tablet by mouth three times a day as needed anxiety  Patient Instructions: 1)  Sertraline- Please start 1/2 tablet one a day for one week, then increase to a full tablet. 2)  It will likely take several weeks before you will notice improvement. 3)  Side effects of this medicine may include drowsiness or nausea.  If this becomes an issue for you call for further instructions. 4)  Very rarely people may develop suicidal thoughts when taking these types of medicines- should this happen to you, discontinue medication and go directly to the emergency room. 5)  You will be contacted about your referral to a therapist. 6)  Please arrange  a follow up appointment in 1 month. Prescriptions: LISINOPRIL 10 MG TABS (LISINOPRIL) one tablet by mouth daily  #30 x 1   Entered and Authorized by:   Lemont Fillers FNP   Signed by:   Lemont Fillers FNP on 02/11/2010   Method used:   Electronically to        [id number]       [address]       [place], [place]  [address]       Ph: [phone] or [phone]       Fax: [phone]   RxID:   [id number] KLONOPIN 0.5 MG TABS (CLONAZEPAM) one tablet by mouth three times a day as needed anxiety  #30 x 0   Entered and Authorized by:   Lemont Fillers FNP   Signed by:   Lemont Fillers FNP  on 02/11/2010   Method used:   Print then Give to Patient   RxID:   [id number] SERTRALINE HCL 50 MG TABS (SERTRALINE HCL) one tablet by mouth daily  #30 x 1   Entered and Authorized by:   Lemont Fillers FNP   Signed by:   Lemont Fillers FNP on 02/11/2010   Method used:   Electronically to        [id number]       [address]       [place], [place]  [address]       Ph: [phone] or [phone]       Fax: [phone]   RxID:   [id number]   Current Allergies (reviewed today): No known allergies

## 2010-07-12 NOTE — Assessment & Plan Note (Signed)
Summary: cpx- jr   Vital Signs:  Patient profile:   35 year old male Weight:      271 pounds BMI:     36.89 Temp:     98.4 degrees F oral Pulse rate:   92 / minute Pulse rhythm:   regular Resp:     16 per minute BP sitting:   130 / 96  (right arm) Cuff size:   large  Vitals Entered By: Mervin Kung CMA (August 20, 2009 2:34 PM) CC: room 4  Physical Is Patient Diabetic? No   CC:  room 4  Physical.  History of Present Illness: Joel Gallagher is a 35 year old male who presents today for a complete physical.  He was noted on his labs to have fasting hyperglycemia (glucose 150).  Tells me his dad has diabetes.    Snoring- wife note + snoring with pauses.    Preventative- Last tetanus greater than 10 years ago.  Does not exercise regularly.  Diet often is not healthy.  Has gained weight since last visit.  Preventive Screening-Counseling & Management  Alcohol-Tobacco     Smoking Status: never  Allergies (verified): No Known Drug Allergies  Review of Systems       Constitutional: Denies Fever ENT:  Denies nasal congestion or sore throat. Resp: Denies cough CV:  Denies Chest Pain GI:  Denies nausea or vomitting GU: Denies dysuria Lymphatic: Denies lymphadenopathy Musculoskeletal:  Denies muscle/joint pain Skin:  Denies Rashes, has mole on his back Psychiatric: Denies depression Neuro: Denies numbness     Physical Exam  General:  overweight AA male, awake and alert, NAD Head:  Normocephalic and atraumatic without obvious abnormalities. No apparent alopecia or balding. Eyes:  PERRLA Ears:  External ear exam shows no significant lesions or deformities.  Otoscopic examination reveals clear canals, tympanic membranes are intact bilaterally without bulging, retraction, inflammation or discharge. Hearing is grossly normal bilaterally. Mouth:  Oral mucosa and oropharynx without lesions or exudates.  Teeth in good repair. Neck:  No deformities, masses, or tenderness  noted. Lungs:  Normal respiratory effort, chest expands symmetrically. Lungs are clear to auscultation, no crackles or wheezes. Heart:  Normal rate and regular rhythm. S1 and S2 normal without gallop, murmur, click, rub or other extra sounds. Abdomen:  Bowel sounds positive,abdomen soft and non-tender without masses, organomegaly or hernias noted. Msk:  No deformity or scoliosis noted of thoracic or lumbar spine.   Extremities:  No clubbing, cyanosis, edema, or deformity noted with normal full range of motion of all joints.   Neurologic:  No cranial nerve deficits noted. Station and gait are normal. Plantar reflexes are down-going bilaterally. DTRs are symmetrical throughout. Sensory, motor and coordinative functions appear intact. Skin:  Intact without suspicious lesions or rashes Cervical Nodes:  No lymphadenopathy noted Psych:  Cognition and judgment appear intact. Alert and cooperative with normal attention span and concentration. No apparent delusions, illusions, hallucinations   Impression & Recommendations:  Problem # 1:  Preventive Health Care (ICD-V70.0) Assessment Comment Only Patient counselled on diet, exercise and weight loss.  Immunizations reviewed, will plan for tetanus next visit.     Problem # 2:  SNORING (ICD-786.09) Assessment: Comment Only I suggested sleep study.  Patient declines at this time but will consider.   His updated medication list for this problem includes:    Lisinopril 10 Mg Tabs (Lisinopril) ..... One tablet by mouth daily  Problem # 3:  GERD (ICD-530.81) Assessment: New I suggested that patient avoid eating  for 2 hours prior to bed and avoid foods that aggravate GERD.  Will give a trial of prilosec.   His updated medication list for this problem includes:    Prilosec 20 Mg Cpdr (Omeprazole) ..... One tablet by mouth daily  Problem # 4:  ELEVATED BLOOD PRESSURE WITHOUT DIAGNOSIS OF HYPERTENSION (ICD-796.2) BP remains elevated.  Will add ace inhibitor  for BP control and renal protection in setting of hyperglycemia.  Pt advised on signs and symptoms of angioedema and advised to d/c med and go to ER if these symptoms occur.  Also advised that this may cause cough.  Plan f/u in 2 weeks for bp check and BMET. BP today: 130/96 Prior BP: 134/98 (07/14/2009)  Labs Reviewed: Creat: 1.18 (07/23/2009) Chol: 140 (07/23/2009)   HDL: 42 (07/23/2009)   LDL: 66 (07/23/2009)   TG: 160 (07/23/2009)  Instructed in low sodium diet (DASH Handout) and behavior modification.    His updated medication list for this problem includes:    Lisinopril 10 Mg Tabs (Lisinopril) ..... One tablet by mouth daily  Problem # 5:  DIABETES MELLITUS (ICD-250.00) Assessment: New  Fasting glucose was 150.  Patient advised on diabetic diet, exercise and weight loss.  Will refer to nutrition.  Pt tells me he recently had an eye exam.   Orders: T-Hgb A1C ([id number]) Nutrition Referral (Nutrition) T-Urine Microalbumin w/creat. ratio ([phone])  His updated medication list for this problem includes:    Lisinopril 10 Mg Tabs (Lisinopril) ..... One tablet by mouth daily  Labs Reviewed: Creat: 1.18 (07/23/2009)     Complete Medication List: 1)  Flonase 50 Mcg/act Susp (Fluticasone propionate) .... 2 sprays each nostril daily 2)  Prilosec 20 Mg Cpdr (Omeprazole) .... One tablet by mouth daily 3)  Lisinopril 10 Mg Tabs (Lisinopril) .... One tablet by mouth daily  Patient Instructions: 1)  Please schedule a follow-up appointment in 2 weeks for follow up  2)  Schedule a separate apt for mole removal. Prescriptions: LISINOPRIL 10 MG TABS (LISINOPRIL) one tablet by mouth daily  #30 x 1   Entered and Authorized by:   Lemont Fillers FNP   Signed by:   Lemont Fillers FNP on 08/20/2009   Method used:   Electronically to        [facility]       [address]       [place], [place]  [address]       Ph: [phone] or [phone]       Fax:  [phone]   RxID:   [id number]      Immunization History:  Influenza Immunization History:    Influenza:  historical (04/12/2009)   Current Allergies (reviewed today): No known allergies

## 2010-10-25 NOTE — H&P (Signed)
Joel Gallagher, Joel Gallagher                 ACCOUNT NO.:  [id number]   MEDICAL RECORD NO.:  [id number]          PATIENT TYPE:  INP   LOCATION:  1618                         FACILITY:  [facility]   PHYSICIAN:  Velora Heckler, MD      DATE OF BIRTH:  [birth year]   DATE OF ADMISSION:  10/20/2006  DATE OF DISCHARGE:                              HISTORY & PHYSICAL   REFERRING PHYSICIAN:  Dr. Lorre Nick, emergency department.   CHIEF COMPLAINT:  Abdominal pain.   HISTORY OF PRESENT ILLNESS:  The patient is 35 year old black male who  presents to the emergency department with a 12-hour history of abdominal  pain localizing to the right lower quadrant.  This has been associated  with severe nausea but no emesis.  The patient has noted fevers and  chills.  He has had no previous such illness.  He is had no diarrhea or  constipation.  He has had no prior abdominal surgery.   PAST MEDICAL HISTORY:  Unremarkable.   MEDICATIONS:  None.   ALLERGIES:  None known.   SOCIAL HISTORY:  The patient is married.  No children.  He works for a  Therapist, music.  He does not smoke.  He does not drink alcohol.   FAMILY HISTORY:  Noncontributory.   REVIEW OF SYSTEMS:  A 15-system review without significant other  finding.   PHYSICAL EXAMINATION:  GENERAL:  A 35 year old black male, well-  developed, well-nourished in no acute distress on a stretcher in the  emergency department.  Temperature 98.5, pulse 81, respirations 20,  blood pressure 145/95.  HEENT:  Shows him to be normocephalic, atraumatic.  Sclerae clear.  Conjunctiva clear.  Pupils equal and reactive.  Dentition good.  Mucous  membranes moist.  Voice normal.  Palpation of the neck shows no mass.  No nodularity.  No lymphadenopathy.  LUNGS:  Are clear to auscultation bilaterally without rales, rhonchi or  wheeze.  CARDIAC EXAM:  Shows regular rate and rhythm without murmur.  Peripheral  pulses are full.  ABDOMEN:  Is soft.  Mildly obese.   There are bowel sounds present.  There is tenderness to percussion and palpation, particularly in the  right lower quadrant.  There is no guarding.  There is mild rebound  tenderness.  There is no palpable mass.  There is no sign of hernia.  EXTREMITIES:  Nontender without edema.  NEUROLOGICALLY:  The patient is alert and oriented without tremor.   LABORATORY STUDIES:  White count 16.3, hemoglobin 15.2, platelet count  284,000.  Differential shows 90% segmented neutrophils.  Electrolytes  were normal.   RADIOGRAPHIC STUDIES:  CT scan abdomen and pelvis reviewed with Dr.  Alver Fisher of radiology.  This appears to be consistent with acute  appendicitis.  There is no sign of perforation.  There may be an  appendicolith at the base of the appendix.   IMPRESSION:  Acute appendicitis.   PLAN:  The patient will be admitted to [facility].  He will be started on intravenous antibiotics.  The patient will be  prepared and taken to  the operating room for appendectomy.  The patient  will require routine postoperative care.   I have discussed with the patient the indications for surgery.  We have  discussed laparoscopic appendectomy versus open surgery.  He understands  and wishes to proceed.  Will make arrangements with the operating room  now.      Velora Heckler, MD  Electronically Signed     TMG/MEDQ  D:  10/20/2006  T:  10/20/2006  Job:  [id number]   cc:   Velora Heckler, MD  [address] [place]  [place] [address]

## 2010-10-25 NOTE — Op Note (Signed)
Joel Gallagher, Joel Gallagher                 ACCOUNT NO.:  [id number]   MEDICAL RECORD NO.:  [id number]          PATIENT TYPE:  INP   LOCATION:  0098                         FACILITY:  [facility]   PHYSICIAN:  Velora Heckler, MD      DATE OF BIRTH:  [birth year]   DATE OF PROCEDURE:  10/20/2006  DATE OF DISCHARGE:                               OPERATIVE REPORT   PREOPERATIVE DIAGNOSIS:  Acute appendicitis.   POSTOPERATIVE DIAGNOSIS:  Acute appendicitis.   PROCEDURE:  Laparoscopic appendectomy.   SURGEON:  Velora Heckler, M.D., FACS   ANESTHESIA:  General per Dr. Sherrian Divers   ESTIMATED BLOOD LOSS:  Minimal.   PREPARATION:  Betadine.   COMPLICATIONS:  None.   INDICATIONS FOR PROCEDURE:  The patient is a 35 year old black male who  presents to the emergency department with a 12 hour history of abdominal  pain localized to the right lower quadrant.  The patient developed  nausea, chills, and sweats.  Laboratory studies showed an elevated white  count of 16,000.  CT scan of the abdomen and pelvis showed findings  consistent with acute appendicitis.  The patient is now brought to  surgery for appendectomy.   BODY OF REPORT:  The procedure was done in OR 1 at the [facility].  The patient was brought to the operating room and placed in a  supine position on the operating table.  Following administration of  general anesthesia, the patient was prepped and draped in the usual  strict aseptic fashion.  After ascertaining that an adequate level of  anesthesia had been obtained, a supraumbilical incision was made with a  #15 blade.  Dissection was carried down to the fascia.  The fascia was  incised in the midline and the peritoneal cavity was entered cautiously.  A 0 Vicryl purse-string suture was placed in the fascia.  A Hasson  cannula was introduced under direct vision and secured with a purse-  string suture.  The abdomen was insufflated with carbon dioxide.  The  laparoscope was  introduced and the abdomen was explored.   There was an acutely inflamed indurated appendix in the right lower  quadrant without signs of perforation.  Operative ports were placed in  the right upper quadrant and left lower quadrant.  The appendix was  grasped and elevated.  The mesoappendix is taken down with the  Harmonic  scalpel.  Dissection was carried down to the base of the appendix which  was carefully dissected out and well defined.  Using an endo-GIA stapler  with a vascular cartridge, the base of the appendix is transected at its  junction with the cecal wall.  The appendix is placed into an endocatch  bag.  The staple line is inspected and good hemostasis is noted.  The  appendix and the endocatch bag is withdrawn through the umbilical port  without difficulty.   The right lower quadrant is irrigated and good hemostasis is noted.  The  fluid is evacuated.  Pneumoperitoneum is released.  The ports were  removed under direct vision.  The 0  Vicryl purse-string suture at the  umbilicus is tied securely.  All three wounds are anesthetized with  local anesthetic.  All three wounds are closed with interrupted 4-0  Vicryl subcuticular sutures.  The wounds were washed and dried, Benzoin  and Steri-Strips were applied.  Sterile dressings were applied.  The  patient was awakened from anesthesia and brought to the recovery room in  stable condition.  The patient tolerated the procedure well.      Velora Heckler, MD  Electronically Signed     TMG/MEDQ  D:  10/20/2006  T:  10/20/2006  Job:  [id number]

## 2011-01-11 ENCOUNTER — Encounter: Payer: Self-pay | Admitting: Family

## 2011-05-30 ENCOUNTER — Emergency Department (HOSPITAL_BASED_OUTPATIENT_CLINIC_OR_DEPARTMENT_OTHER)
Admission: EM | Admit: 2011-05-30 | Discharge: 2011-05-30 | Disposition: A | Discharge status: Home / Self Care | Payer: Self-pay | Attending: Emergency Medicine | Admitting: Emergency Medicine

## 2011-05-30 ENCOUNTER — Emergency Department (INDEPENDENT_AMBULATORY_CARE_PROVIDER_SITE_OTHER): Payer: Self-pay

## 2011-05-30 ENCOUNTER — Encounter (HOSPITAL_BASED_OUTPATIENT_CLINIC_OR_DEPARTMENT_OTHER): Payer: Self-pay | Admitting: Emergency Medicine

## 2011-05-30 DIAGNOSIS — J9819 Other pulmonary collapse: Secondary | ICD-10-CM

## 2011-05-30 DIAGNOSIS — R509 Fever, unspecified: Secondary | ICD-10-CM | POA: Insufficient documentation

## 2011-05-30 DIAGNOSIS — R42 Dizziness and giddiness: Secondary | ICD-10-CM

## 2011-05-30 DIAGNOSIS — R110 Nausea: Secondary | ICD-10-CM | POA: Insufficient documentation

## 2011-05-30 DIAGNOSIS — R0602 Shortness of breath: Secondary | ICD-10-CM

## 2011-05-30 DIAGNOSIS — B9789 Other viral agents as the cause of diseases classified elsewhere: Secondary | ICD-10-CM | POA: Insufficient documentation

## 2011-05-30 DIAGNOSIS — R05 Cough: Secondary | ICD-10-CM

## 2011-05-30 DIAGNOSIS — B349 Viral infection, unspecified: Secondary | ICD-10-CM

## 2011-05-30 HISTORY — DX: Other seasonal allergic rhinitis: J30.2

## 2011-05-30 MED ORDER — OSELTAMIVIR PHOSPHATE 75 MG PO CAPS
75.0000 mg | ORAL_CAPSULE | Freq: Once | ORAL | Status: AC
  Administered 2011-05-30: 75 mg via ORAL
  Filled 2011-05-30: qty 1

## 2011-05-30 MED ORDER — ALBUTEROL SULFATE HFA 108 (90 BASE) MCG/ACT IN AERS: 2.0000 | INHALATION_SPRAY | Freq: Once | RESPIRATORY_TRACT | Status: DC

## 2011-05-30 MED ORDER — OSELTAMIVIR PHOSPHATE 75 MG PO CAPS: 75.0000 mg | ORAL_CAPSULE | Freq: Two times a day (BID) | ORAL | Status: AC

## 2011-05-30 NOTE — ED Notes (Signed)
Pt having nasal congestion, productive cough, yellow sputum, fever, chills, sore throat, generalized malaise, dizziness since yesterday.  No V/D.  Decreased appetite but able to keep down fluids.

## 2011-05-30 NOTE — ED Provider Notes (Signed)
History     CSN: [id number] Arrival date & time: 05/30/2011 12:24 PM   First MD Initiated Contact with Patient 05/30/11 1232      Chief Complaint  Patient presents with  . Cough  . Fever  . Chills  . Sore Throat  . Nausea     The history is provided by the patient and the spouse.   the patient reports approximately 24 hours of fever, chills, cough, congestion, nasal congestion, sore throat and nausea without vomiting.  He denies diarrhea.  Denies chest abdominal pain.  He reports recent sick contacts.  Nothing worsens the symptoms.  Nothing improves his symptoms.  His symptoms are constant.  His symptoms are moderate in severity  Past Medical History  Diagnosis Date  . Seasonal allergies     Past Surgical History  Procedure Date  . Appendectomy 2008    Family History  Problem Relation Age of Onset  . Hypertension Mother   . Diabetes Father   . Asthma Brother     History  Substance Use Topics  . Smoking status: Never Smoker   . Smokeless tobacco: Not on file  . Alcohol Use: No      Review of Systems  Constitutional: Positive for fever.  Respiratory: Positive for cough.   All other systems reviewed and are negative.    Allergies  Review of patient's allergies indicates no known allergies.  Home Medications   Current Outpatient Rx  Name Route Sig Dispense Refill  . AMLODIPINE BESYLATE 5 MG PO TABS Oral Take 5 mg by mouth daily.      Marland Kitchen CLONAZEPAM 0.5 MG PO TABS Oral Take 0.5 mg by mouth 3 (three) times daily as needed. For anxiety     . FLUTICASONE PROPIONATE 50 MCG/ACT NA SUSP Nasal Place 2 sprays into the nose daily.      Marland Kitchen OMEPRAZOLE 20 MG PO CPDR Oral Take 20 mg by mouth daily.      . SERTRALINE HCL 50 MG PO TABS Oral Take 50 mg by mouth daily.        BP 140/88  Pulse 109  Resp 16  SpO2 97%  Physical Exam  Nursing note and vitals reviewed. Constitutional: He is oriented to person, place, and time. He appears well-developed and well-nourished.   HENT:  Head: Normocephalic and atraumatic.       Uvula midline.  Posterior pharynx is with mild erythema without exudate.  No tonsillar swelling or exudate.  No peritonsillar swelling  Eyes: EOM are normal.  Neck: Normal range of motion. Neck supple.       No meningeal sign  Cardiovascular: Normal rate, regular rhythm, normal heart sounds and intact distal pulses.   Pulmonary/Chest: Effort normal and breath sounds normal. No respiratory distress.  Abdominal: Soft. He exhibits no distension. There is no tenderness.  Musculoskeletal: Normal range of motion.  Neurological: He is alert and oriented to person, place, and time.  Skin: Skin is warm and dry.  Psychiatric: He has a normal mood and affect. Judgment normal.    ED Course  Procedures (including critical care time)  Labs Reviewed - No data to display Dg Chest 2 View  05/30/2011  *RADIOLOGY REPORT*  Clinical Data: Cough, shortness of breath and dizziness.  CHEST - 2 VIEW  Comparison: 07/13/2009  Findings: Scattered areas of parenchymal scarring and atelectasis noted bilaterally, especially at the left lung base.  No evidence of acute infiltrate, edema, pneumothorax, nodule or pleural effusion.  Heart size is normal.  The bony thorax is unremarkable.  IMPRESSION: No active disease.  Scattered parenchymal scarring and atelectasis.  Original Report Authenticated By: Reola Calkins, M.D.   I personally reviewed the x-ray  1. Viral syndrome       MDM  I suspect viral/influenza-like illness.  The patient will obtain a chest x-ray given his cough and mild shortness of breath.  Likely discharge home with Tamiflu        Lyanne Co, MD 05/30/11 1322

## 2016-07-25 ENCOUNTER — Ambulatory Visit: Payer: 59 | Admitting: Licensed Clinical Social Worker

## 2016-08-01 ENCOUNTER — Ambulatory Visit (INDEPENDENT_AMBULATORY_CARE_PROVIDER_SITE_OTHER): Payer: 59 | Admitting: Licensed Clinical Social Worker

## 2016-08-01 DIAGNOSIS — F410 Panic disorder [episodic paroxysmal anxiety] without agoraphobia: Secondary | ICD-10-CM | POA: Diagnosis not present

## 2016-08-14 ENCOUNTER — Ambulatory Visit (INDEPENDENT_AMBULATORY_CARE_PROVIDER_SITE_OTHER): Payer: 59 | Admitting: Licensed Clinical Social Worker

## 2016-08-14 DIAGNOSIS — F410 Panic disorder [episodic paroxysmal anxiety] without agoraphobia: Secondary | ICD-10-CM

## 2016-08-22 ENCOUNTER — Ambulatory Visit: Payer: 59 | Admitting: Licensed Clinical Social Worker

## 2016-09-06 ENCOUNTER — Ambulatory Visit: Payer: 59 | Admitting: Licensed Clinical Social Worker

## 2016-09-06 ENCOUNTER — Ambulatory Visit: Payer: Self-pay | Admitting: Licensed Clinical Social Worker

## 2016-09-07 ENCOUNTER — Ambulatory Visit (INDEPENDENT_AMBULATORY_CARE_PROVIDER_SITE_OTHER): Payer: 59 | Admitting: Licensed Clinical Social Worker

## 2016-09-07 DIAGNOSIS — F3341 Major depressive disorder, recurrent, in partial remission: Secondary | ICD-10-CM | POA: Diagnosis not present

## 2016-09-22 ENCOUNTER — Ambulatory Visit (INDEPENDENT_AMBULATORY_CARE_PROVIDER_SITE_OTHER): Payer: 59 | Admitting: Licensed Clinical Social Worker

## 2016-09-22 DIAGNOSIS — F3341 Major depressive disorder, recurrent, in partial remission: Secondary | ICD-10-CM

## 2019-08-15 ENCOUNTER — Ambulatory Visit: Payer: Self-pay | Attending: Internal Medicine

## 2019-08-15 DIAGNOSIS — Z23 Encounter for immunization: Secondary | ICD-10-CM | POA: Insufficient documentation

## 2019-08-15 NOTE — Progress Notes (Signed)
   Covid-19 Vaccination Clinic  Name:  BREVON SIQUEIRA    MRN: [id number] DOB: 1975-09-18  08/15/2019  Mr. Aho was observed post Covid-19 immunization for 15 minutes without incident. He was provided with Vaccine Information Sheet and instruction to access the V-Safe system.   Mr. Enge was instructed to call 911 with any severe reactions post vaccine: Marland Kitchen Difficulty breathing  . Swelling of face and throat  . A fast heartbeat  . A bad rash all over body  . Dizziness and weakness   Immunizations Administered    Name Date Dose VIS Date Route   Pfizer COVID-19 Vaccine 08/15/2019  5:53 PM 0.3 mL 05/23/2019 Intramuscular   Manufacturer: [place]   Lot: [id number]   [place]: [id number]

## 2019-09-16 ENCOUNTER — Ambulatory Visit: Payer: Self-pay | Attending: Internal Medicine

## 2019-09-16 DIAGNOSIS — Z23 Encounter for immunization: Secondary | ICD-10-CM

## 2019-09-16 NOTE — Progress Notes (Signed)
   Covid-19 Vaccination Clinic  Name:  Joel Gallagher    MRN: [id number] DOB: [birth year]  09/16/2019  Mr. Joel Gallagher was observed post Covid-19 immunization for 15 minutes without incident. He was provided with Vaccine Information Sheet and instruction to access the V-Safe system.   Mr. Joel Gallagher was instructed to call 911 with any severe reactions post vaccine: Marland Kitchen Difficulty breathing  . Swelling of face and throat  . A fast heartbeat  . A bad rash all over body  . Dizziness and weakness   Immunizations Administered    Name Date Dose VIS Date Route   Pfizer COVID-19 Vaccine 09/16/2019  5:06 PM 0.3 mL 05/23/2019 Intramuscular   Manufacturer: [employer], [employer]   Lot: [id number]   [place]: [id number]

## 2020-07-05 DIAGNOSIS — R030 Elevated blood-pressure reading, without diagnosis of hypertension: Secondary | ICD-10-CM | POA: Diagnosis not present

## 2020-07-05 DIAGNOSIS — N529 Male erectile dysfunction, unspecified: Secondary | ICD-10-CM | POA: Diagnosis not present

## 2020-07-05 DIAGNOSIS — E119 Type 2 diabetes mellitus without complications: Secondary | ICD-10-CM | POA: Diagnosis not present

## 2020-07-09 DIAGNOSIS — F322 Major depressive disorder, single episode, severe without psychotic features: Secondary | ICD-10-CM | POA: Diagnosis not present

## 2020-07-09 DIAGNOSIS — F419 Anxiety disorder, unspecified: Secondary | ICD-10-CM | POA: Diagnosis not present

## 2020-07-09 DIAGNOSIS — G44209 Tension-type headache, unspecified, not intractable: Secondary | ICD-10-CM | POA: Diagnosis not present

## 2020-07-09 DIAGNOSIS — R030 Elevated blood-pressure reading, without diagnosis of hypertension: Secondary | ICD-10-CM | POA: Diagnosis not present

## 2020-08-06 DIAGNOSIS — F419 Anxiety disorder, unspecified: Secondary | ICD-10-CM | POA: Diagnosis not present

## 2020-08-06 DIAGNOSIS — G44209 Tension-type headache, unspecified, not intractable: Secondary | ICD-10-CM | POA: Diagnosis not present

## 2020-08-06 DIAGNOSIS — R030 Elevated blood-pressure reading, without diagnosis of hypertension: Secondary | ICD-10-CM | POA: Diagnosis not present

## 2020-08-06 DIAGNOSIS — E119 Type 2 diabetes mellitus without complications: Secondary | ICD-10-CM | POA: Diagnosis not present

## 2020-10-07 DIAGNOSIS — N528 Other male erectile dysfunction: Secondary | ICD-10-CM | POA: Diagnosis not present

## 2020-10-13 DIAGNOSIS — N528 Other male erectile dysfunction: Secondary | ICD-10-CM | POA: Diagnosis not present

## 2020-11-12 DIAGNOSIS — Z20822 Contact with and (suspected) exposure to covid-19: Secondary | ICD-10-CM | POA: Diagnosis not present

## 2020-11-12 DIAGNOSIS — R051 Acute cough: Secondary | ICD-10-CM | POA: Diagnosis not present

## 2020-11-12 DIAGNOSIS — U071 COVID-19: Secondary | ICD-10-CM | POA: Diagnosis not present

## 2020-12-10 DIAGNOSIS — N528 Other male erectile dysfunction: Secondary | ICD-10-CM | POA: Diagnosis not present

## 2020-12-10 DIAGNOSIS — N486 Induration penis plastica: Secondary | ICD-10-CM | POA: Diagnosis not present

## 2021-01-07 DIAGNOSIS — N486 Induration penis plastica: Secondary | ICD-10-CM | POA: Diagnosis not present

## 2021-01-07 DIAGNOSIS — N528 Other male erectile dysfunction: Secondary | ICD-10-CM | POA: Diagnosis not present

## 2021-03-17 DIAGNOSIS — N486 Induration penis plastica: Secondary | ICD-10-CM | POA: Diagnosis not present

## 2021-03-17 DIAGNOSIS — E119 Type 2 diabetes mellitus without complications: Secondary | ICD-10-CM | POA: Diagnosis not present

## 2021-03-17 DIAGNOSIS — I10 Essential (primary) hypertension: Secondary | ICD-10-CM | POA: Diagnosis not present

## 2021-05-04 DIAGNOSIS — I10 Essential (primary) hypertension: Secondary | ICD-10-CM | POA: Diagnosis not present

## 2021-05-11 DIAGNOSIS — I10 Essential (primary) hypertension: Secondary | ICD-10-CM | POA: Diagnosis not present

## 2021-07-04 DIAGNOSIS — E119 Type 2 diabetes mellitus without complications: Secondary | ICD-10-CM | POA: Diagnosis not present

## 2021-07-04 DIAGNOSIS — I10 Essential (primary) hypertension: Secondary | ICD-10-CM | POA: Diagnosis not present

## 2021-08-15 DIAGNOSIS — I10 Essential (primary) hypertension: Secondary | ICD-10-CM | POA: Diagnosis not present

## 2021-08-15 DIAGNOSIS — E119 Type 2 diabetes mellitus without complications: Secondary | ICD-10-CM | POA: Diagnosis not present

## 2021-10-21 DIAGNOSIS — Z125 Encounter for screening for malignant neoplasm of prostate: Secondary | ICD-10-CM | POA: Diagnosis not present

## 2021-10-21 DIAGNOSIS — Z0000 Encounter for general adult medical examination without abnormal findings: Secondary | ICD-10-CM | POA: Diagnosis not present

## 2021-10-28 DIAGNOSIS — E1165 Type 2 diabetes mellitus with hyperglycemia: Secondary | ICD-10-CM | POA: Diagnosis not present

## 2021-10-28 DIAGNOSIS — F419 Anxiety disorder, unspecified: Secondary | ICD-10-CM | POA: Diagnosis not present

## 2021-10-28 DIAGNOSIS — Z0000 Encounter for general adult medical examination without abnormal findings: Secondary | ICD-10-CM | POA: Diagnosis not present

## 2021-10-28 DIAGNOSIS — I10 Essential (primary) hypertension: Secondary | ICD-10-CM | POA: Diagnosis not present

## 2022-02-17 DIAGNOSIS — N528 Other male erectile dysfunction: Secondary | ICD-10-CM | POA: Diagnosis not present

## 2022-02-17 DIAGNOSIS — N486 Induration penis plastica: Secondary | ICD-10-CM | POA: Diagnosis not present

## 2022-04-21 DIAGNOSIS — F419 Anxiety disorder, unspecified: Secondary | ICD-10-CM | POA: Diagnosis not present

## 2022-04-21 DIAGNOSIS — E1165 Type 2 diabetes mellitus with hyperglycemia: Secondary | ICD-10-CM | POA: Diagnosis not present

## 2022-04-21 DIAGNOSIS — I10 Essential (primary) hypertension: Secondary | ICD-10-CM | POA: Diagnosis not present

## 2022-05-15 DIAGNOSIS — E1165 Type 2 diabetes mellitus with hyperglycemia: Secondary | ICD-10-CM | POA: Diagnosis not present

## 2022-05-15 DIAGNOSIS — I10 Essential (primary) hypertension: Secondary | ICD-10-CM | POA: Diagnosis not present

## 2022-08-14 DIAGNOSIS — F419 Anxiety disorder, unspecified: Secondary | ICD-10-CM | POA: Diagnosis not present

## 2022-08-14 DIAGNOSIS — E1165 Type 2 diabetes mellitus with hyperglycemia: Secondary | ICD-10-CM | POA: Diagnosis not present

## 2022-08-14 DIAGNOSIS — I10 Essential (primary) hypertension: Secondary | ICD-10-CM | POA: Diagnosis not present

## 2022-12-04 DIAGNOSIS — Z125 Encounter for screening for malignant neoplasm of prostate: Secondary | ICD-10-CM | POA: Diagnosis not present

## 2022-12-04 DIAGNOSIS — E1165 Type 2 diabetes mellitus with hyperglycemia: Secondary | ICD-10-CM | POA: Diagnosis not present

## 2022-12-04 DIAGNOSIS — I10 Essential (primary) hypertension: Secondary | ICD-10-CM | POA: Diagnosis not present

## 2022-12-04 DIAGNOSIS — Z0000 Encounter for general adult medical examination without abnormal findings: Secondary | ICD-10-CM | POA: Diagnosis not present

## 2022-12-11 DIAGNOSIS — E1165 Type 2 diabetes mellitus with hyperglycemia: Secondary | ICD-10-CM | POA: Diagnosis not present

## 2022-12-11 DIAGNOSIS — Z0000 Encounter for general adult medical examination without abnormal findings: Secondary | ICD-10-CM | POA: Diagnosis not present

## 2022-12-11 DIAGNOSIS — N182 Chronic kidney disease, stage 2 (mild): Secondary | ICD-10-CM | POA: Diagnosis not present

## 2022-12-11 DIAGNOSIS — F419 Anxiety disorder, unspecified: Secondary | ICD-10-CM | POA: Diagnosis not present

## 2022-12-11 DIAGNOSIS — I10 Essential (primary) hypertension: Secondary | ICD-10-CM | POA: Diagnosis not present

## 2023-04-02 DIAGNOSIS — I10 Essential (primary) hypertension: Secondary | ICD-10-CM | POA: Diagnosis not present

## 2023-04-02 DIAGNOSIS — N182 Chronic kidney disease, stage 2 (mild): Secondary | ICD-10-CM | POA: Diagnosis not present

## 2023-04-02 DIAGNOSIS — F419 Anxiety disorder, unspecified: Secondary | ICD-10-CM | POA: Diagnosis not present

## 2023-04-02 DIAGNOSIS — E1165 Type 2 diabetes mellitus with hyperglycemia: Secondary | ICD-10-CM | POA: Diagnosis not present

## 2023-04-09 DIAGNOSIS — N182 Chronic kidney disease, stage 2 (mild): Secondary | ICD-10-CM | POA: Diagnosis not present

## 2023-04-09 DIAGNOSIS — E1165 Type 2 diabetes mellitus with hyperglycemia: Secondary | ICD-10-CM | POA: Diagnosis not present

## 2023-04-09 DIAGNOSIS — I10 Essential (primary) hypertension: Secondary | ICD-10-CM | POA: Diagnosis not present

## 2023-06-01 DIAGNOSIS — E1165 Type 2 diabetes mellitus with hyperglycemia: Secondary | ICD-10-CM | POA: Diagnosis not present

## 2023-06-01 DIAGNOSIS — I10 Essential (primary) hypertension: Secondary | ICD-10-CM | POA: Diagnosis not present

## 2023-06-01 DIAGNOSIS — N182 Chronic kidney disease, stage 2 (mild): Secondary | ICD-10-CM | POA: Diagnosis not present

## 2023-07-30 DIAGNOSIS — I10 Essential (primary) hypertension: Secondary | ICD-10-CM | POA: Diagnosis not present

## 2023-07-30 DIAGNOSIS — N182 Chronic kidney disease, stage 2 (mild): Secondary | ICD-10-CM | POA: Diagnosis not present

## 2023-07-30 DIAGNOSIS — E1165 Type 2 diabetes mellitus with hyperglycemia: Secondary | ICD-10-CM | POA: Diagnosis not present

## 2023-08-06 DIAGNOSIS — I10 Essential (primary) hypertension: Secondary | ICD-10-CM | POA: Diagnosis not present

## 2023-08-06 DIAGNOSIS — N182 Chronic kidney disease, stage 2 (mild): Secondary | ICD-10-CM | POA: Diagnosis not present

## 2023-08-06 DIAGNOSIS — E1165 Type 2 diabetes mellitus with hyperglycemia: Secondary | ICD-10-CM | POA: Diagnosis not present

## 2023-11-15 DIAGNOSIS — N486 Induration penis plastica: Secondary | ICD-10-CM | POA: Diagnosis not present

## 2023-12-27 DIAGNOSIS — N182 Chronic kidney disease, stage 2 (mild): Secondary | ICD-10-CM | POA: Diagnosis not present

## 2023-12-27 DIAGNOSIS — I10 Essential (primary) hypertension: Secondary | ICD-10-CM | POA: Diagnosis not present

## 2023-12-27 DIAGNOSIS — E1165 Type 2 diabetes mellitus with hyperglycemia: Secondary | ICD-10-CM | POA: Diagnosis not present

## 2023-12-27 DIAGNOSIS — Z125 Encounter for screening for malignant neoplasm of prostate: Secondary | ICD-10-CM | POA: Diagnosis not present

## 2023-12-31 DIAGNOSIS — Z0000 Encounter for general adult medical examination without abnormal findings: Secondary | ICD-10-CM | POA: Diagnosis not present

## 2023-12-31 DIAGNOSIS — I10 Essential (primary) hypertension: Secondary | ICD-10-CM | POA: Diagnosis not present

## 2023-12-31 DIAGNOSIS — N182 Chronic kidney disease, stage 2 (mild): Secondary | ICD-10-CM | POA: Diagnosis not present

## 2023-12-31 DIAGNOSIS — E1165 Type 2 diabetes mellitus with hyperglycemia: Secondary | ICD-10-CM | POA: Diagnosis not present
